# Patient Record
Sex: Male | Born: 1955 | Race: White | Hispanic: No | Marital: Married | Smoking: Former smoker
Health system: Southern US, Community
[De-identification: ages and names within clinical notes are randomized; demographics above are authoritative.]

## PROBLEM LIST (undated history)

## (undated) DIAGNOSIS — M797 Fibromyalgia: Secondary | ICD-10-CM

## (undated) DIAGNOSIS — G4733 Obstructive sleep apnea (adult) (pediatric): Secondary | ICD-10-CM

## (undated) DIAGNOSIS — I4901 Ventricular fibrillation: Secondary | ICD-10-CM

## (undated) DIAGNOSIS — I4581 Long QT syndrome: Secondary | ICD-10-CM

## (undated) DIAGNOSIS — M7918 Myalgia, other site: Secondary | ICD-10-CM

## (undated) HISTORY — PX: CARDIAC DEFIBRILLATOR PLACEMENT: SHX171

## (undated) HISTORY — DX: Obstructive sleep apnea (adult) (pediatric): G47.33

## (undated) HISTORY — DX: Ventricular fibrillation: I49.01

## (undated) HISTORY — DX: Long QT syndrome: I45.81

## (undated) HISTORY — PX: NECK SURGERY: SHX720

## (undated) HISTORY — DX: Myalgia, other site: M79.18

---

## 1998-04-11 HISTORY — PX: BACK SURGERY: SHX140

## 2002-12-23 DIAGNOSIS — D229 Melanocytic nevi, unspecified: Secondary | ICD-10-CM

## 2002-12-23 HISTORY — DX: Melanocytic nevi, unspecified: D22.9

## 2003-04-12 HISTORY — PX: CHOLECYSTECTOMY: SHX55

## 2010-01-29 ENCOUNTER — Encounter: Payer: Self-pay | Admitting: Pulmonary Disease

## 2010-02-25 ENCOUNTER — Encounter: Payer: Self-pay | Admitting: Pulmonary Disease

## 2010-02-25 ENCOUNTER — Ambulatory Visit (HOSPITAL_BASED_OUTPATIENT_CLINIC_OR_DEPARTMENT_OTHER)
Admission: RE | Admit: 2010-02-25 | Discharge: 2010-02-25 | Payer: Self-pay | Source: Home / Self Care | Admitting: Rheumatology

## 2010-03-06 ENCOUNTER — Ambulatory Visit: Payer: Self-pay | Admitting: Internal Medicine

## 2010-03-26 ENCOUNTER — Ambulatory Visit: Payer: Self-pay | Admitting: Pulmonary Disease

## 2010-03-26 DIAGNOSIS — G4752 REM sleep behavior disorder: Secondary | ICD-10-CM

## 2010-03-26 DIAGNOSIS — I4891 Unspecified atrial fibrillation: Secondary | ICD-10-CM

## 2010-03-26 DIAGNOSIS — IMO0001 Reserved for inherently not codable concepts without codable children: Secondary | ICD-10-CM

## 2010-03-26 DIAGNOSIS — G4733 Obstructive sleep apnea (adult) (pediatric): Secondary | ICD-10-CM

## 2010-04-08 ENCOUNTER — Encounter: Payer: Self-pay | Admitting: Pulmonary Disease

## 2010-04-21 ENCOUNTER — Telehealth (INDEPENDENT_AMBULATORY_CARE_PROVIDER_SITE_OTHER): Payer: Self-pay | Admitting: *Deleted

## 2010-05-05 ENCOUNTER — Encounter: Payer: Self-pay | Admitting: Pulmonary Disease

## 2010-05-10 ENCOUNTER — Encounter: Payer: Self-pay | Admitting: Pulmonary Disease

## 2010-05-13 ENCOUNTER — Encounter: Payer: Self-pay | Admitting: Pulmonary Disease

## 2010-05-13 NOTE — Progress Notes (Signed)
Summary: fax request  Phone Note Call from Patient Call back at Home Phone [phone]   Caller: Patient Call For: sood Summary of Call: pt requests that his sleep study and rx for cpap be faxed to: [facility] [phone]. pt can be reached at home # or [phone] Initial call taken by: Tivis Ringer, CNA,  April 21, 2010 5:06 PM  Follow-up for Phone Call        Faxed notes.//Juanita Follow-up by: Darletta Moll,  April 22, 2010 9:04 AM

## 2010-05-13 NOTE — Letter (Signed)
Summary: [facility]  [facility]   Imported By: Sherian Rein 04/08/2010 10:56:11  _____________________________________________________________________  External Attachment:    Type:   Image     Comment:   External Document

## 2010-05-13 NOTE — Assessment & Plan Note (Signed)
Summary: sleep apena/cb   Visit Type:  Initial Consult Copy to:  Dr. Pollyann Savoy  CC:  Sleep consult...epworth score is 16.Marland Kitchen  History of Present Illness: 55 yo male for sleep evauation.  He had sleep study on Nov. 17, 2011: AHI 16.1, SpO2 low 84%.  He is followed by Dr. Corliss Skains for fibromyalgia.  During this evaluation it was determined that he may have a sleep problem.  As a result he was scheduled for a sleep test and results are detailed above.  He has noticed a problem for some time, and things have been getting worse.  This has been associated with a 30 lbs weight gain over the past 5 years.    He goes to bed at 9pm.  He will take soma at 830 pm.  He falls asleep quickly.  He wakes up several times to use the bathroom and because of pain symptoms.  He usually can fall back to sleep.  He gets out of bed at 6am.  He feels tired and occasionally gets headaches in the morning.  He drinks a cup of coffee in the morning, but does not use anything else to help stay awake.  He does snore and will wake up with a gasp.  He has trouble sleeping on his back.  He does talk in his sleep, and used to grind his teeth.  He gets frequent dreams.  He has an episode about once every 6 months in which he will act out his dream.  Upon awakening he usually has dream recall, and is quickly aware of his surroundings.  He denies head injury, stroke, or seizure.  He quit smoking 9 years ago.  He has occasional alcohol use.  He denies thyroid problem or history of depression.   Preventive Screening-Counseling & Management  Alcohol-Tobacco     Alcohol drinks/day: <1     Smoking Status: quit     Packs/Day: 0.75     Year Started: 1984     Year Quit: 2002  Current Medications (verified): 1)  Inderal La 80 Mg Xr24h-Cap (Propranolol Hcl) .... 1/2 By Mouth Daily 2)  Soma 350 Mg Tabs (Carisoprodol) .Marland Kitchen.. 1 By Mouth Daily 3)  Cymbalta 60 Mg Cpep (Duloxetine Hcl) .Marland Kitchen.. 1 By Mouth Daily 4)  Lyrica 50 Mg Caps  (Pregabalin) .Marland Kitchen.. 1 By Mouth Three Times A Day 5)  Oxycontin 10 Mg Xr12h-Tab (Oxycodone Hcl) .Marland Kitchen.. 1 By Mouth Two Times A Day As Needed 6)  B-100  Tabs (Vitamins-Lipotropics) .Marland Kitchen.. 1 By Mouth Daily 7)  Magnesium 500 Mg Caps (Magnesium Oxide) .Marland Kitchen.. 1 By Mouth Daily 8)  Magnesium Oxide 400 Mg Caps (Magnesium Oxide) .... 2 By Mouth Three Times A Day 9)  Vitamin D3 400 Unit Tabs (Cholecalciferol) .Marland Kitchen.. 1 By Mouth Two Times A Day 10)  Coq10 100 Mg Caps (Coenzyme Q10) .Marland Kitchen.. 1 By Mouth Three Times A Day 11)  Calcium 500 Mg Tabs (Calcium) .Marland Kitchen.. 1 By Mouth Three Times A Day 12)  Fish Oil 1000 Mg Caps (Omega-3 Fatty Acids) .Marland Kitchen.. 1 By Mouth Daily 13)  Vitamin B-12 2500 Mcg Subl (Cyanocobalamin) .Marland Kitchen.. 1 By Mouth Daily  Allergies (verified): No Known Drug Allergies  Past History:  Past Medical History: Ventricular fibrillation Prolonged QT syndrome Myofascial pain syndrome Obstructive sleep apnea      - PSG 02/25/10 AHI 16.1  Physician roster: Rheum - [facility] - [facility]  Past Surgical History: Back surgery in 2000 Neck surgery in 2003 and 2005 Cholecystectomy in  2005  Family History: Family History Colon Cancer---father Family History Diabetes---father, MGM  Social History: Patient states former smoker.  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  The patient denies shortness of breath with activity, shortness of breath at rest, productive cough, non-productive cough, coughing up blood, chest pain, irregular heartbeats, acid heartburn, indigestion, loss of appetite, abdominal pain, difficulty swallowing, sore throat, tooth/dental problems, headaches, nasal congestion/difficulty breathing through nose, sneezing, itching, ear ache, anxiety, depression, hand/feet swelling, joint stiffness or pain, rash, change in color of mucus, and fever.    Vital Signs:  Patient profile:    55 year old male Height:      71 inches (180.34 cm) Weight:      236 pounds (107.27 kg) BMI:     33.03 O2 Sat:      99 % on Room air Temp:     98.0 degrees F (36.67 degrees C) oral Pulse rate:   72 / minute BP sitting:   110 / 74  (left arm) Cuff size:   large  Vitals Entered By: Michel Bickers CMA (March 26, 2010 8:46 AM)  O2 Sat at Rest %:  99 O2 Flow:  Room air CC: Sleep consult...epworth score is 16. Is Patient Diabetic? No Comments Medications reviewed with patient Michel Bickers [facility]  March 26, 2010 8:57 AM   Physical Exam  General:  normal appearance, healthy appearing, and obese.   Eyes:  PERRLA and EOMI.   Nose:  no deformity, discharge, inflammation, or lesions Mouth:  MP 4, enlarged tongue, retrognathic, elongated uvula Neck:  no JVD.   Chest Wall:  no deformities noted Lungs:  clear bilaterally to auscultation and percussion Heart:  regular rate and rhythm, S1, S2 without murmurs, rubs, gallops, or clicks Abdomen:  bowel sounds positive; abdomen soft and non-tender without masses, or organomegaly Msk:  no deformity or scoliosis noted with normal posture Pulses:  pulses normal Extremities:  no clubbing, cyanosis, edema, or deformity noted Neurologic:  normal CN II-XII and strength normal.   Cervical Nodes:  no significant adenopathy Psych:  alert and cooperative; normal mood and affect; normal attention span and concentration   Impression & Recommendations:  Problem # 1:  OBSTRUCTIVE SLEEP APNEA (ICD-327.23) He has sleep disruption, snore, apnea, and daytimes sleepiness.  He has moderate sleep apnea on his sleep study.  I reviewed the result of his sleep test.  Explained how sleep apnea can affect his health.  Driving precautions and need for weight loss were reviewed.  Treatment options were discussed.  Will proceed with autoCPAP titration at home.  Discussed techniques to adjust to using CPAP.  Explained that he may need in-lab titration if autoCPAP is  unsuccessful.  If he is not able to tolerate CPAP, then an oral appliance may be an option also.  Problem # 2:  FIBROMYALGIA (ICD-729.1) Explained to him how sleep disruption related to OSA can affect his fibromyalgia.  Hopefully his pain symptoms will improve with therapy for his sleep apnea.  Problem # 3:  REM SLEEP BEHAVIOR DISORDER (ICD-327.42) He gives typical description for RBD.  This can be seen with sleep apnea.  Will re-assess after he is established on CPAP therapy.  Discussed safe sleep environment.  Medications Added to Medication List This Visit: 1)  Inderal La 80 Mg Xr24h-cap (Propranolol hcl) .... 1/2 by mouth daily 2)  Soma 350 Mg  Tabs (Carisoprodol) .Marland Kitchen.. 1 by mouth daily 3)  Cymbalta 60 Mg Cpep (Duloxetine hcl) .Marland Kitchen.. 1 by mouth daily 4)  Lyrica 50 Mg Caps (Pregabalin) .Marland Kitchen.. 1 by mouth three times a day 5)  Oxycontin 10 Mg Xr12h-tab (Oxycodone hcl) .Marland Kitchen.. 1 by mouth two times a day as needed 6)  B-100 Tabs (Vitamins-lipotropics) .Marland Kitchen.. 1 by mouth daily 7)  Magnesium 500 Mg Caps (Magnesium oxide) .Marland Kitchen.. 1 by mouth daily 8)  Magnesium Oxide 400 Mg Caps (Magnesium oxide) .... 2 by mouth three times a day 9)  Vitamin D3 400 Unit Tabs (Cholecalciferol) .Marland Kitchen.. 1 by mouth two times a day 10)  Coq10 100 Mg Caps (Coenzyme q10) .Marland Kitchen.. 1 by mouth three times a day 11)  Calcium 500 Mg Tabs (Calcium) .Marland Kitchen.. 1 by mouth three times a day 12)  Fish Oil 1000 Mg Caps (Omega-3 fatty acids) .Marland Kitchen.. 1 by mouth daily 13)  Vitamin B-12 2500 Mcg Subl (Cyanocobalamin) .Marland Kitchen.. 1 by mouth daily  Complete Medication List: 1)  Inderal La 80 Mg Xr24h-cap (Propranolol hcl) .... 1/2 by mouth daily 2)  Soma 350 Mg Tabs (Carisoprodol) .Marland Kitchen.. 1 by mouth daily 3)  Cymbalta 60 Mg Cpep (Duloxetine hcl) .Marland Kitchen.. 1 by mouth daily 4)  Lyrica 50 Mg Caps (Pregabalin) .Marland Kitchen.. 1 by mouth three times a day 5)  Oxycontin 10 Mg Xr12h-tab (Oxycodone hcl) .Marland Kitchen.. 1 by mouth two times a day as needed 6)  B-100 Tabs (Vitamins-lipotropics) .Marland Kitchen.. 1 by  mouth daily 7)  Magnesium 500 Mg Caps (Magnesium oxide) .Marland Kitchen.. 1 by mouth daily 8)  Magnesium Oxide 400 Mg Caps (Magnesium oxide) .... 2 by mouth three times a day 9)  Vitamin D3 400 Unit Tabs (Cholecalciferol) .Marland Kitchen.. 1 by mouth two times a day 10)  Coq10 100 Mg Caps (Coenzyme q10) .Marland Kitchen.. 1 by mouth three times a day 11)  Calcium 500 Mg Tabs (Calcium) .Marland Kitchen.. 1 by mouth three times a day 12)  Fish Oil 1000 Mg Caps (Omega-3 fatty acids) .Marland Kitchen.. 1 by mouth daily 13)  Vitamin B-12 2500 Mcg Subl (Cyanocobalamin) .Marland Kitchen.. 1 by mouth daily  Other Orders: Consultation Level IV (04540) DME Referral (DME)  Patient Instructions: 1)  Will set up CPAP machine at home 2)  Follow up in 8 weeks

## 2010-05-13 NOTE — Letter (Signed)
Summary: CMN for PAP/[facility]  CMN for PAP/[facility]   Imported By: Sherian Rein 04/21/2010 14:38:32  _____________________________________________________________________  External Attachment:    Type:   Image     Comment:   External Document

## 2010-05-27 NOTE — Letter (Signed)
Summary: CMN for CPAP/[facility]  CMN for CPAP/[facility]   Imported By: Sherian Rein 05/18/2010 09:40:17  _____________________________________________________________________  External Attachment:    Type:   Image     Comment:   External Document

## 2010-06-02 NOTE — Letter (Signed)
Summary: CPAP Referral/[facility]  CPAP Referral/[facility]   Imported By: Sherian Rein 05/24/2010 08:19:27  _____________________________________________________________________  External Attachment:    Type:   Image     Comment:   External Document

## 2010-07-08 ENCOUNTER — Emergency Department (HOSPITAL_COMMUNITY): Payer: PRIVATE HEALTH INSURANCE

## 2010-07-08 ENCOUNTER — Emergency Department (HOSPITAL_COMMUNITY)
Admission: EM | Admit: 2010-07-08 | Discharge: 2010-07-08 | Disposition: A | Discharge status: Home / Self Care | Payer: PRIVATE HEALTH INSURANCE | Attending: Emergency Medicine | Admitting: Emergency Medicine

## 2010-07-08 DIAGNOSIS — R55 Syncope and collapse: Secondary | ICD-10-CM | POA: Insufficient documentation

## 2010-07-08 DIAGNOSIS — R112 Nausea with vomiting, unspecified: Secondary | ICD-10-CM | POA: Insufficient documentation

## 2010-07-08 DIAGNOSIS — R42 Dizziness and giddiness: Secondary | ICD-10-CM | POA: Insufficient documentation

## 2010-07-08 LAB — COMPREHENSIVE METABOLIC PANEL
ALT: 75 U/L — ABNORMAL HIGH (ref 0–53)
AST: 51 U/L — ABNORMAL HIGH (ref 0–37)
Calcium: 9 mg/dL (ref 8.4–10.5)
GFR calc Af Amer: >60 mL/min (ref >60)
Sodium: 138 mEq/L (ref 135–145)
Total Protein: 6.2 g/dL (ref 6.0–8.3)

## 2010-07-08 LAB — POCT CARDIAC MARKERS: Myoglobin, poc: 77 ng/mL (ref 12–200)

## 2010-07-08 LAB — CBC
MCHC: 35.6 g/dL (ref 30.0–36.0)
Platelets: 164 10*3/uL (ref 150–400)
RDW: 13.3 % (ref 11.5–15.5)

## 2010-07-08 LAB — DIFFERENTIAL
Eosinophils Absolute: 0.1 10*3/uL (ref 0.0–0.7)
Eosinophils Relative: 2 % (ref 0–5)
Lymphs Abs: 1.6 10*3/uL (ref 0.7–4.0)
Monocytes Absolute: 0.5 10*3/uL (ref 0.1–1.0)
Monocytes Relative: 10 % (ref 3–12)

## 2010-09-08 ENCOUNTER — Encounter: Payer: Self-pay | Admitting: Pulmonary Disease

## 2010-09-08 ENCOUNTER — Telehealth: Payer: Self-pay | Admitting: Pulmonary Disease

## 2010-09-08 NOTE — Telephone Encounter (Signed)
Will have my nurse inform patient that CPAP report looked good with good control of sleep apnea.    He is overdue for follow up, and will have my nurse schedule appointment.

## 2010-09-09 NOTE — Telephone Encounter (Signed)
I informed pt of VS's findings and recommendations. Pt verbalized understanding. Scheduled to be seen 09/10/10 at 11:45am.

## 2010-09-10 ENCOUNTER — Encounter: Payer: Self-pay | Admitting: Pulmonary Disease

## 2010-09-10 ENCOUNTER — Ambulatory Visit (INDEPENDENT_AMBULATORY_CARE_PROVIDER_SITE_OTHER): Payer: PRIVATE HEALTH INSURANCE | Admitting: Pulmonary Disease

## 2010-09-10 VITALS — BP 122/82 | HR 65 | Temp 97.7°F | Ht 71.0 in | Wt 239.6 lb

## 2010-09-10 DIAGNOSIS — G4752 REM sleep behavior disorder: Secondary | ICD-10-CM

## 2010-09-10 DIAGNOSIS — G4733 Obstructive sleep apnea (adult) (pediatric): Secondary | ICD-10-CM

## 2010-09-10 NOTE — Assessment & Plan Note (Signed)
His recent CPAP download showed good control of his apnea with CPAP at 10 cm H2O.  Will set his CPAP at this pressure.  He is to call if he has more trouble with change in pressure.

## 2010-09-10 NOTE — Progress Notes (Signed)
Subjective:    Patient ID: Sean Munoz, male    DOB: 09-20-1955, 55 y.o.   MRN: [id number]  HPI Copy to: Dr. Pollyann Savoy  55 yo male with OSA and RBD.  He has been doing well with CPAP.  He has a full face mask.  He feels this helps his sleep, and energy during the day.  He feels like he has too much pressure from the mask in the early morning hours.  He does not have trouble falling asleep with the mask.  He has some mask leak, and has tried to tighten the mask.  This does not always help.  Past Medical History  Diagnosis Date  . Ventricular fibrillation   . Prolonged QT syndrome   . Myofascial pain syndrome   . OSA (obstructive sleep apnea)     PSG 02/25/10  AHI 16.1     Family History  Problem Relation Age of Onset  . Colon cancer Father   . Diabetes Father   . Diabetes Maternal Grandmother      History   Social History  . Marital Status: Married    Spouse Name: N/A    Number of Children: N/A  . Years of Education: N/A   Occupational History  . Not on file.   Social History Main Topics  . Smoking status: Former Smoker -- 1.0 packs/day for 30 years    Quit date: 04/11/2000  . Smokeless tobacco: Not on file  . Alcohol Use: Not on file  . Drug Use: Not on file  . Sexually Active: Not on file   Other Topics Concern  . Not on file   Social History Narrative  . No narrative on file     No Known Allergies   No outpatient prescriptions prior to visit.   Review of Systems     Objective:   Physical Exam  Filed Vitals:   09/10/10 1221 09/10/10 1224  BP: 122/82   Pulse: 65   Temp: 97.7 F (36.5 C)   TempSrc: Oral   Height: 5\' 11"  (1.803 m)   Weight: 239 lb 9.6 oz (108.682 kg)   SpO2: 95% 95%    General: normal appearance, healthy appearing, and obese.  Eyes: PERRLA and EOMI.  Nose: no deformity, discharge, inflammation, or lesions  Mouth: MP 4, enlarged tongue, retrognathic, elongated uvula  Neck: no JVD.  Chest Wall: no deformities  noted  Lungs: clear bilaterally to auscultation and percussion  Heart: regular rate and rhythm, S1, S2 without murmurs, rubs, gallops, or clicks  Abdomen: bowel sounds positive; abdomen soft and non-tender without masses, or organomegaly  Msk: no deformity or scoliosis noted with normal posture  Pulses: pulses normal  Extremities: no clubbing, cyanosis, edema, or deformity noted  Neurologic: normal CN II-XII and strength normal.  Cervical Nodes: no significant adenopathy  Psych: alert and cooperative; normal mood and affect; normal attention span and concentration      Assessment & Plan:   OBSTRUCTIVE SLEEP APNEA His recent CPAP download showed good control of his apnea with CPAP at 10 cm H2O.  Will set his CPAP at this pressure.  He is to call if he has more trouble with change in pressure.  REM sleep behavior disorder Will monitor this clinically while treating his sleep apnea.      Updated Medication List Outpatient Encounter Prescriptions as of 09/10/2010  Medication Sig Dispense Refill  . Calcium Carbonate (CALCIUM 500 PO) Take 1 tablet by mouth 2 (two) times daily.        Marland Kitchen  carisoprodol (SOMA) 350 MG tablet Take 350 mg by mouth daily.        . Coenzyme Q10 (CO Q10) 100 MG CAPS Take 1 tablet by mouth 2 (two) times daily.        . Cyanocobalamin (VITAMIN B 12) 250 MCG LOZG Place 1 tablet under the tongue daily.        . DULoxetine (CYMBALTA) 60 MG capsule Take 60 mg by mouth daily.        . fish oil-omega-3 fatty acids 1000 MG capsule Take 2 g by mouth daily.        . magnesium oxide (MAG-OX) 400 MG tablet Take 400 mg by mouth 2 (two) times daily.        . Magnesium Oxide 500 MG TABS Take 1 tablet by mouth daily.        Marland Kitchen oxyCODONE-acetaminophen (PERCOCET) 10-325 MG per tablet Take 1 tablet by mouth every 8 (eight) hours as needed.        . pregabalin (LYRICA) 50 MG capsule Take 50 mg by mouth 2 (two) times daily.        . propranolol (INDERAL) 40 MG tablet Take 40 mg by mouth  daily.        . Thiamine HCl (VITAMIN B-1) 100 MG tablet Take 100 mg by mouth daily.        . vitamin E 400 UNIT capsule Take 400 Units by mouth 2 (two) times daily.        Marland Kitchen DISCONTD: oxyCODONE-acetaminophen (PERCOCET) 5-325 MG per tablet Take 1 tablet by mouth every 8 (eight) hours as needed.

## 2010-09-10 NOTE — Patient Instructions (Signed)
Will set CPAP at 10 cm H2O Follow up in 6 months

## 2010-09-10 NOTE — Assessment & Plan Note (Signed)
Will monitor this clinically while treating his sleep apnea.

## 2010-09-14 ENCOUNTER — Encounter: Payer: Self-pay | Admitting: Pulmonary Disease

## 2010-09-19 ENCOUNTER — Emergency Department (HOSPITAL_COMMUNITY)
Admission: EM | Admit: 2010-09-19 | Discharge: 2010-09-19 | Disposition: A | Discharge status: Home / Self Care | Payer: PRIVATE HEALTH INSURANCE | Attending: Emergency Medicine | Admitting: Emergency Medicine

## 2010-09-19 DIAGNOSIS — Z79899 Other long term (current) drug therapy: Secondary | ICD-10-CM | POA: Insufficient documentation

## 2010-09-19 DIAGNOSIS — Z950 Presence of cardiac pacemaker: Secondary | ICD-10-CM | POA: Insufficient documentation

## 2010-09-19 DIAGNOSIS — Y9239 Other specified sports and athletic area as the place of occurrence of the external cause: Secondary | ICD-10-CM | POA: Insufficient documentation

## 2010-09-19 DIAGNOSIS — W268XXA Contact with other sharp object(s), not elsewhere classified, initial encounter: Secondary | ICD-10-CM | POA: Insufficient documentation

## 2010-09-19 DIAGNOSIS — S6990XA Unspecified injury of unspecified wrist, hand and finger(s), initial encounter: Secondary | ICD-10-CM | POA: Insufficient documentation

## 2010-09-19 DIAGNOSIS — S61209A Unspecified open wound of unspecified finger without damage to nail, initial encounter: Secondary | ICD-10-CM | POA: Insufficient documentation

## 2013-03-23 IMAGING — CR DG CHEST 1V
1 series · 1 of 1 positions shown · non-contrast
Comparison: None

CLINICAL DATA: Syncope

CHEST - 1 VIEW

[view not recorded]
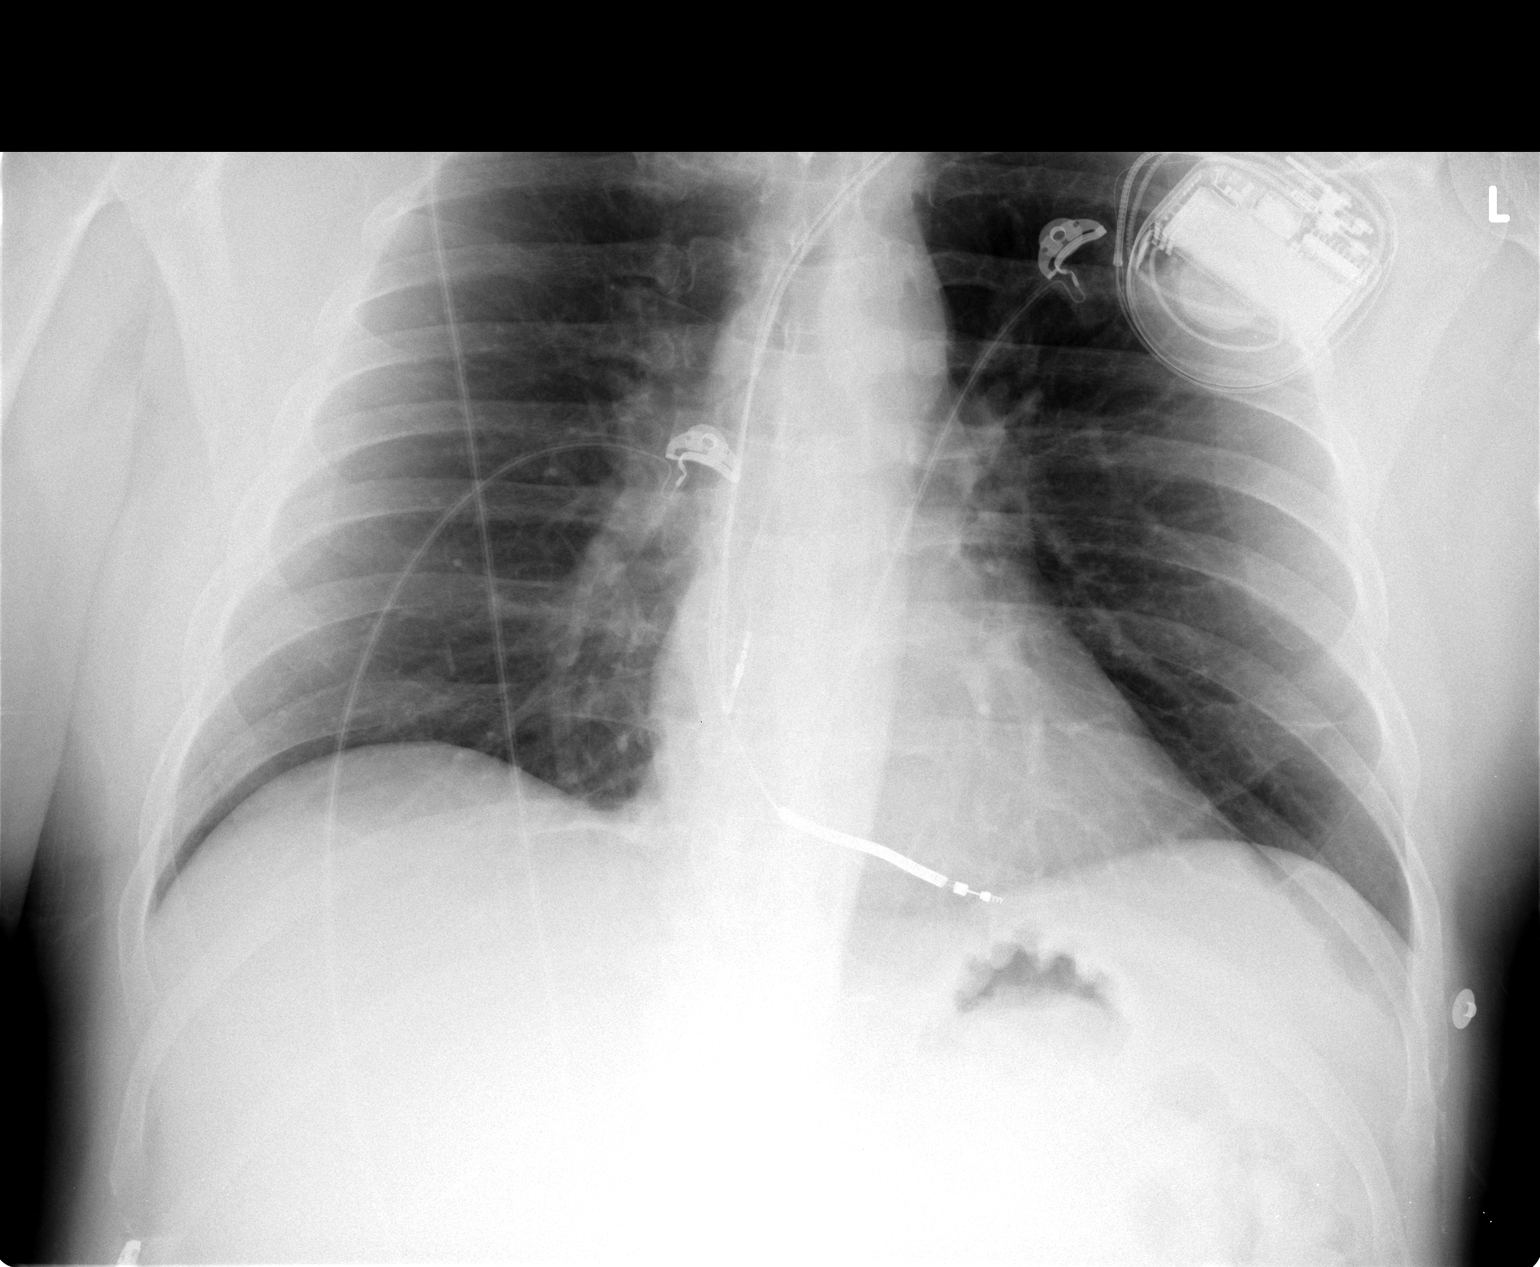

[1 of 1 positions shown; findings below may reference images not displayed]

FINDINGS: Heart size is normal.  Dual lead pacemaker with leads in
the right atrium and right ventricle.  Negative for heart failure.
Lungs are clear without infiltrate or effusion.
IMPRESSION: No active cardiopulmonary disease.

## 2013-07-16 ENCOUNTER — Emergency Department (HOSPITAL_BASED_OUTPATIENT_CLINIC_OR_DEPARTMENT_OTHER): Payer: PRIVATE HEALTH INSURANCE

## 2013-07-16 ENCOUNTER — Encounter (HOSPITAL_BASED_OUTPATIENT_CLINIC_OR_DEPARTMENT_OTHER): Payer: Self-pay | Admitting: Emergency Medicine

## 2013-07-16 ENCOUNTER — Emergency Department (HOSPITAL_BASED_OUTPATIENT_CLINIC_OR_DEPARTMENT_OTHER)
Admission: EM | Admit: 2013-07-16 | Discharge: 2013-07-16 | Disposition: A | Discharge status: Home / Self Care | Payer: PRIVATE HEALTH INSURANCE | Attending: Emergency Medicine | Admitting: Emergency Medicine

## 2013-07-16 DIAGNOSIS — W208XXA Other cause of strike by thrown, projected or falling object, initial encounter: Secondary | ICD-10-CM | POA: Insufficient documentation

## 2013-07-16 DIAGNOSIS — S60229A Contusion of unspecified hand, initial encounter: Secondary | ICD-10-CM | POA: Insufficient documentation

## 2013-07-16 DIAGNOSIS — S60222A Contusion of left hand, initial encounter: Secondary | ICD-10-CM

## 2013-07-16 DIAGNOSIS — Z8679 Personal history of other diseases of the circulatory system: Secondary | ICD-10-CM | POA: Insufficient documentation

## 2013-07-16 DIAGNOSIS — Z8669 Personal history of other diseases of the nervous system and sense organs: Secondary | ICD-10-CM | POA: Insufficient documentation

## 2013-07-16 DIAGNOSIS — IMO0002 Reserved for concepts with insufficient information to code with codable children: Secondary | ICD-10-CM | POA: Insufficient documentation

## 2013-07-16 DIAGNOSIS — Z87891 Personal history of nicotine dependence: Secondary | ICD-10-CM | POA: Insufficient documentation

## 2013-07-16 DIAGNOSIS — Z8739 Personal history of other diseases of the musculoskeletal system and connective tissue: Secondary | ICD-10-CM | POA: Insufficient documentation

## 2013-07-16 DIAGNOSIS — Y929 Unspecified place or not applicable: Secondary | ICD-10-CM | POA: Insufficient documentation

## 2013-07-16 DIAGNOSIS — Z79899 Other long term (current) drug therapy: Secondary | ICD-10-CM | POA: Insufficient documentation

## 2013-07-16 DIAGNOSIS — Y9389 Activity, other specified: Secondary | ICD-10-CM | POA: Insufficient documentation

## 2013-07-16 DIAGNOSIS — Z95810 Presence of automatic (implantable) cardiac defibrillator: Secondary | ICD-10-CM | POA: Insufficient documentation

## 2013-07-16 HISTORY — DX: Fibromyalgia: M79.7

## 2013-07-16 NOTE — Discharge Instructions (Signed)
Hand Contusion A hand contusion is a deep bruise on your hand area. Contusions are the result of an injury that caused bleeding under the skin. The contusion may turn blue, purple, or yellow. Minor injuries will give you a painless contusion, but more severe contusions may stay painful and swollen for a few weeks. CAUSES  A contusion is usually caused by a blow, trauma, or direct force to an area of the body. SYMPTOMS   Swelling and redness of the injured area.  Discoloration of the injured area.  Tenderness and soreness of the injured area.  Pain. DIAGNOSIS  The diagnosis can be made by taking a history and performing a physical exam. An X-ray, CT scan, or MRI may be needed to determine if there were any associated injuries, such as broken bones (fractures). TREATMENT  Often, the best treatment for a hand contusion is resting, elevating, icing, and applying cold compresses to the injured area. Over-the-counter medicines may also be recommended for pain control. HOME CARE INSTRUCTIONS   Put ice on the injured area.  Put ice in a plastic bag.  Place a towel between your skin and the bag.  Leave the ice on for 15-20 minutes, 03-04 times a day.  Only take over-the-counter or prescription medicines as directed by your caregiver. Your caregiver may recommend avoiding anti-inflammatory medicines (aspirin, ibuprofen, and naproxen) for 48 hours because these medicines may increase bruising.  If told, use an elastic wrap as directed. This can help reduce swelling. You may remove the wrap for sleeping, showering, and bathing. If your fingers become numb, cold, or blue, take the wrap off and reapply it more loosely.  Elevate your hand with pillows to reduce swelling.  Avoid overusing your hand if it is painful. SEEK IMMEDIATE MEDICAL CARE IF:   You have increased redness, swelling, or pain in your hand.  Your swelling or pain is not relieved with medicines.  You have loss of feeling in  your hand or are unable to move your fingers.  Your hand turns cold or blue.  You have pain when you move your fingers.  Your hand becomes warm to the touch.  Your contusion does not improve in 2 days. MAKE SURE YOU:   Understand these instructions.  Will watch your condition.  Will get help right away if you are not doing well or get worse. Document Released: 09/17/2001 Document Revised: 12/21/2011 Document Reviewed: 09/19/2011 [facility] Patient Information 2014 [employer].  Cryotherapy Cryotherapy means treatment with cold. Ice or gel packs can be used to reduce both pain and swelling. Ice is the most helpful within the first 24 to 48 hours after an injury or flareup from overusing a muscle or joint. Sprains, strains, spasms, burning pain, shooting pain, and aches can all be eased with ice. Ice can also be used when recovering from surgery. Ice is effective, has very few side effects, and is safe for most people to use. PRECAUTIONS  Ice is not a safe treatment option for people with:  Raynaud's phenomenon. This is a condition affecting small blood vessels in the extremities. Exposure to cold may cause your problems to return.  Cold hypersensitivity. There are many forms of cold hypersensitivity, including:  Cold urticaria. Red, itchy hives appear on the skin when the tissues begin to warm after being iced.  Cold erythema. This is a red, itchy rash caused by exposure to cold.  Cold hemoglobinuria. Red blood cells break down when the tissues begin to warm after being iced.  The hemoglobin that carry oxygen are passed into the urine because they cannot combine with blood proteins fast enough.  Numbness or altered sensitivity in the area being iced. If you have any of the following conditions, do not use ice until you have discussed cryotherapy with your caregiver:  Heart conditions, such as arrhythmia, angina, or chronic heart disease.  High blood pressure.  Healing wounds  or open skin in the area being iced.  Current infections.  Rheumatoid arthritis.  Poor circulation.  Diabetes. Ice slows the blood flow in the region it is applied. This is beneficial when trying to stop inflamed tissues from spreading irritating chemicals to surrounding tissues. However, if you expose your skin to cold temperatures for too long or without the proper protection, you can damage your skin or nerves. Watch for signs of skin damage due to cold. HOME CARE INSTRUCTIONS Follow these tips to use ice and cold packs safely.  Place a dry or damp towel between the ice and skin. A damp towel will cool the skin more quickly, so you may need to shorten the time that the ice is used.  For a more rapid response, add gentle compression to the ice.  Ice for no more than 10 to 20 minutes at a time. The bonier the area you are icing, the less time it will take to get the benefits of ice.  Check your skin after 5 minutes to make sure there are no signs of a poor response to cold or skin damage.  Rest 20 minutes or more in between uses.  Once your skin is numb, you can end your treatment. You can test numbness by very lightly touching your skin. The touch should be so light that you do not see the skin dimple from the pressure of your fingertip. When using ice, most people will feel these normal sensations in this order: cold, burning, aching, and numbness.  Do not use ice on someone who cannot communicate their responses to pain, such as small children or people with dementia. HOW TO MAKE AN ICE PACK Ice packs are the most common way to use ice therapy. Other methods include ice massage, ice baths, and cryo-sprays. Muscle creams that cause a cold, tingly feeling do not offer the same benefits that ice offers and should not be used as a substitute unless recommended by your caregiver. To make an ice pack, do one of the following:  Place crushed ice or a bag of frozen vegetables in a sealable  plastic bag. Squeeze out the excess air. Place this bag inside another plastic bag. Slide the bag into a pillowcase or place a damp towel between your skin and the bag.  Mix 3 parts water with 1 part rubbing alcohol. Freeze the mixture in a sealable plastic bag. When you remove the mixture from the freezer, it will be slushy. Squeeze out the excess air. Place this bag inside another plastic bag. Slide the bag into a pillowcase or place a damp towel between your skin and the bag. SEEK MEDICAL CARE IF:  You develop white spots on your skin. This may give the skin a blotchy (mottled) appearance.  Your skin turns blue or pale.  Your skin becomes waxy or hard.  Your swelling gets worse. MAKE SURE YOU:   Understand these instructions.  Will watch your condition.  Will get help right away if you are not doing well or get worse. Document Released: 11/22/2010 Document Revised: 06/20/2011 Document Reviewed: 11/22/2010 [employer] Patient  Information ©2014 [employer]. ° °

## 2013-07-16 NOTE — ED Provider Notes (Signed)
Medical screening examination/treatment/procedure(s) were performed by non-physician practitioner and as supervising physician I was immediately available for consultation/collaboration.   EKG Interpretation None        Houston Siren III, MD 07/16/13 1550

## 2013-07-16 NOTE — ED Notes (Signed)
hit left hand with hammer yesterday-bruising/ abrasions noted

## 2013-07-16 NOTE — ED Provider Notes (Signed)
CSN: [id number]     Arrival date & time 07/16/13  1207 History   First MD Initiated Contact with Patient 07/16/13 1215     Chief Complaint  Patient presents with  . Hand Injury     (Consider location/radiation/quality/duration/timing/severity/associated sxs/prior Treatment) Patient is a 58 y.o. male presenting with hand injury. The history is provided by the patient. No language interpreter was used.  Hand Injury Location:  Hand Time since incident:  1 day Hand location:  L hand Associated symptoms: no fever   Associated symptoms comment:  He complains of left hand pain after injury last night. He was hammering when the hammer head flew off from the handle striking him in the dorsal left hand. He reports onset of pain several hours later that woke him from sleep, and swelling that has been progressive since that time. No numbness. No other injury.   Past Medical History  Diagnosis Date  . Ventricular fibrillation   . Prolonged QT syndrome   . Myofascial pain syndrome   . OSA (obstructive sleep apnea)     PSG 02/25/10  AHI 16.1  . Fibromyalgia    Past Surgical History  Procedure Laterality Date  . Back surgery  2000  . Neck surgery  2003 and 2005  . Cholecystectomy  2005  . Cardiac defibrillator placement     Family History  Problem Relation Age of Onset  . Colon cancer Father   . Diabetes Father   . Diabetes Maternal Grandmother    History  Substance Use Topics  . Smoking status: Former Smoker -- 1.00 packs/day for 30 years    Quit date: 04/11/2000  . Smokeless tobacco: Not on file  . Alcohol Use: Yes    Review of Systems  Constitutional: Negative for fever and chills.  HENT: Negative.   Respiratory: Negative.   Cardiovascular: Negative.   Gastrointestinal: Negative.   Musculoskeletal: Negative.        See HPI  Skin: Negative.   Neurological: Negative.       Allergies  Review of patient's allergies indicates no known allergies.  Home Medications    Current Outpatient Rx  Name  Route  Sig  Dispense  Refill  . OxyCODONE HCl (OXYCONTIN PO)   Oral   Take by mouth.         . Calcium Carbonate (CALCIUM 500 PO)   Oral   Take 1 tablet by mouth 2 (two) times daily.           . carisoprodol (SOMA) 350 MG tablet   Oral   Take 350 mg by mouth daily.           . Coenzyme Q10 (CO Q10) 100 MG CAPS   Oral   Take 1 tablet by mouth 2 (two) times daily.           . Cyanocobalamin (VITAMIN B 12) 250 MCG LOZG   Sublingual   Place 1 tablet under the tongue daily.           . DULoxetine (CYMBALTA) 60 MG capsule   Oral   Take 60 mg by mouth daily.           . fish oil-omega-3 fatty acids 1000 MG capsule   Oral   Take 2 g by mouth daily.           . magnesium oxide (MAG-OX) 400 MG tablet   Oral   Take 400 mg by mouth 2 (two) times daily.           Marland Kitchen  Magnesium Oxide 500 MG TABS   Oral   Take 1 tablet by mouth daily.           Marland Kitchen oxyCODONE-acetaminophen (PERCOCET) 10-325 MG per tablet   Oral   Take 1 tablet by mouth every 8 (eight) hours as needed.           . pregabalin (LYRICA) 50 MG capsule   Oral   Take 50 mg by mouth 2 (two) times daily.           . propranolol (INDERAL) 40 MG tablet   Oral   Take 40 mg by mouth daily.           . Thiamine HCl (VITAMIN B-1) 100 MG tablet   Oral   Take 100 mg by mouth daily.           . vitamin E 400 UNIT capsule   Oral   Take 400 Units by mouth 2 (two) times daily.            BP 169/101  Pulse 78  Temp(Src) 98.8 F (37.1 C) (Oral)  Resp 18  Ht 5\' 10"  (1.778 m)  Wt 240 lb (108.863 kg)  BMI 34.44 kg/m2  SpO2 99% Physical Exam  Constitutional: He is oriented to person, place, and time. He appears well-developed and well-nourished.  Neck: Normal range of motion.  Pulmonary/Chest: Effort normal.  Musculoskeletal: Normal range of motion.  Left hand swollen and ecchymotic over 4th and 5th MCs without bony deformity. FROM all digits of left hand with  full tendon function. No wrist tenderness or swelling.   Neurological: He is alert and oriented to person, place, and time.  Skin: Skin is warm and dry.  Three small superficial abrasions dorsal left hand.   Psychiatric: He has a normal mood and affect.    ED Course  Procedures (including critical care time) Labs Review Labs Reviewed - No data to display Imaging Review Dg Hand Complete Left  07/16/2013   CLINICAL DATA:  Hit hand with hammer yesterday. Swelling in the metacarpal region.  EXAM: LEFT HAND - COMPLETE 3+ VIEW  COMPARISON:  None.  FINDINGS: There is no evidence of fracture or dislocation. There is no evidence of arthropathy or other focal bone abnormality. There is soft tissue swelling over the dorsum of the hand at the level of the metacarpals.  IMPRESSION: Soft tissue swelling overlying the metacarpals. No acute bony abnormality.   Electronically Signed   By: Curlene Dolphin M.D.   On: 07/16/2013 12:34     EKG Interpretation None      MDM   Final diagnoses:  None    1. Contusion left hand  Non-fracture contusion injury left hand without complication.    Dewaine Oats, PA-C 07/16/13 1240

## 2016-03-31 IMAGING — CR DG HAND COMPLETE 3+V*L*
3 series · 3 of 3 positions shown · non-contrast
Comparison: None.

CLINICAL DATA: Hit hand with hammer yesterday. Swelling in the
metacarpal region.

EXAM:
LEFT HAND - COMPLETE 3+ VIEW

[x hand pa left]
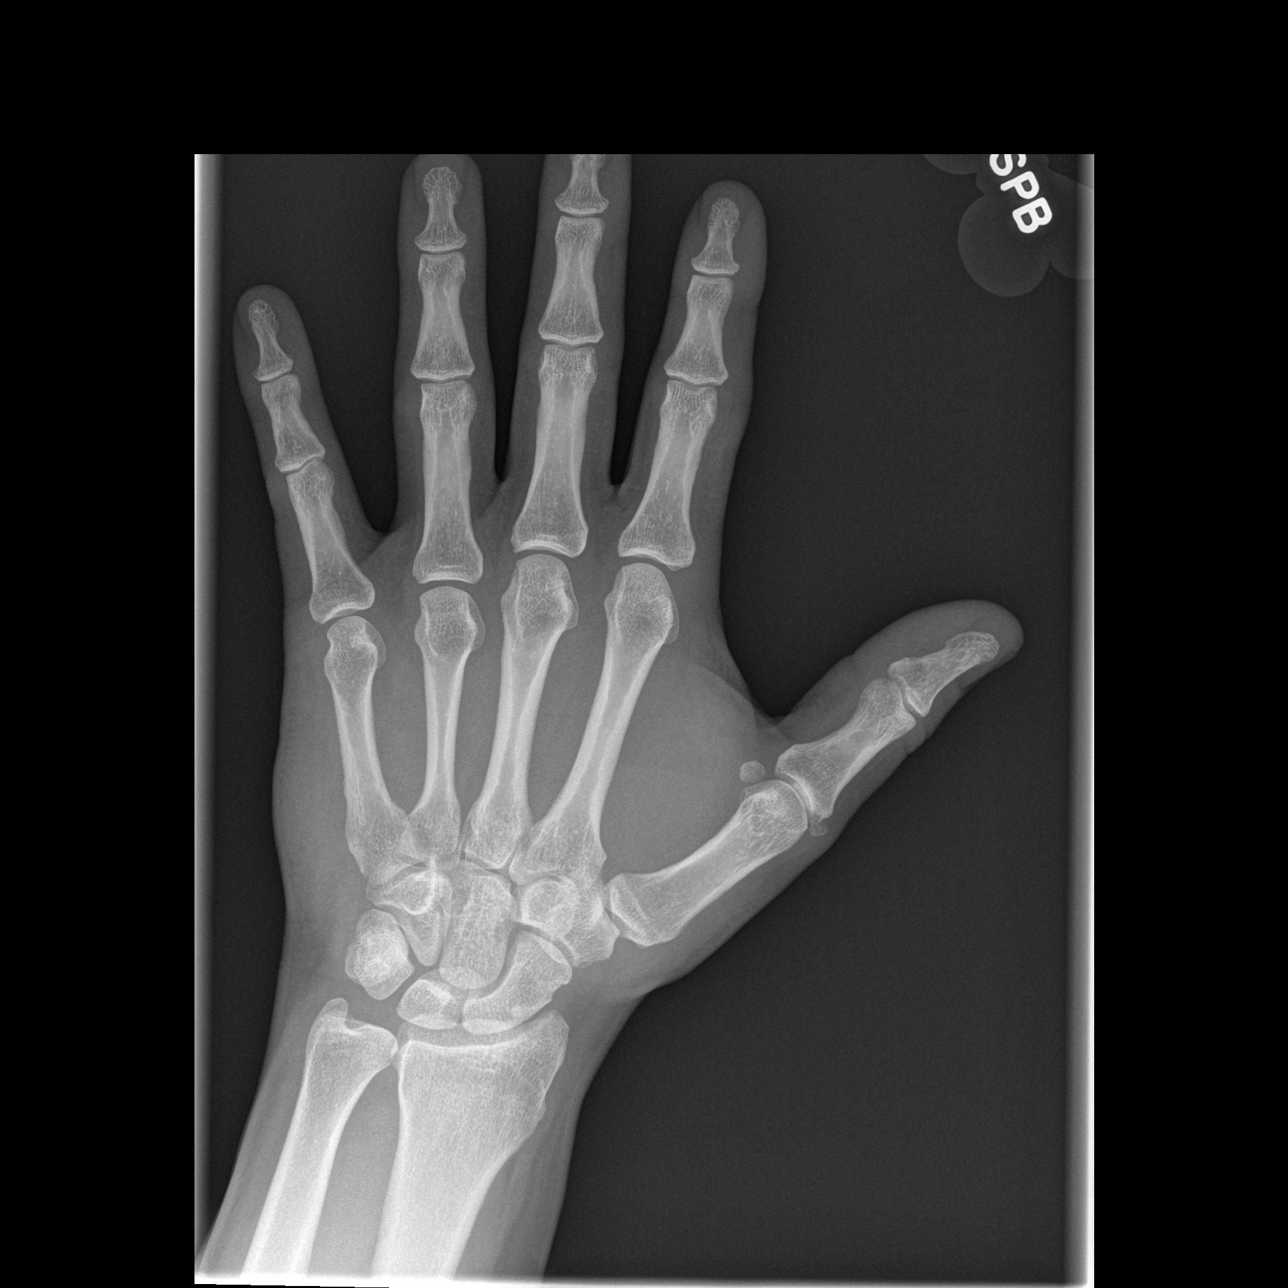

[x hand oblique left]
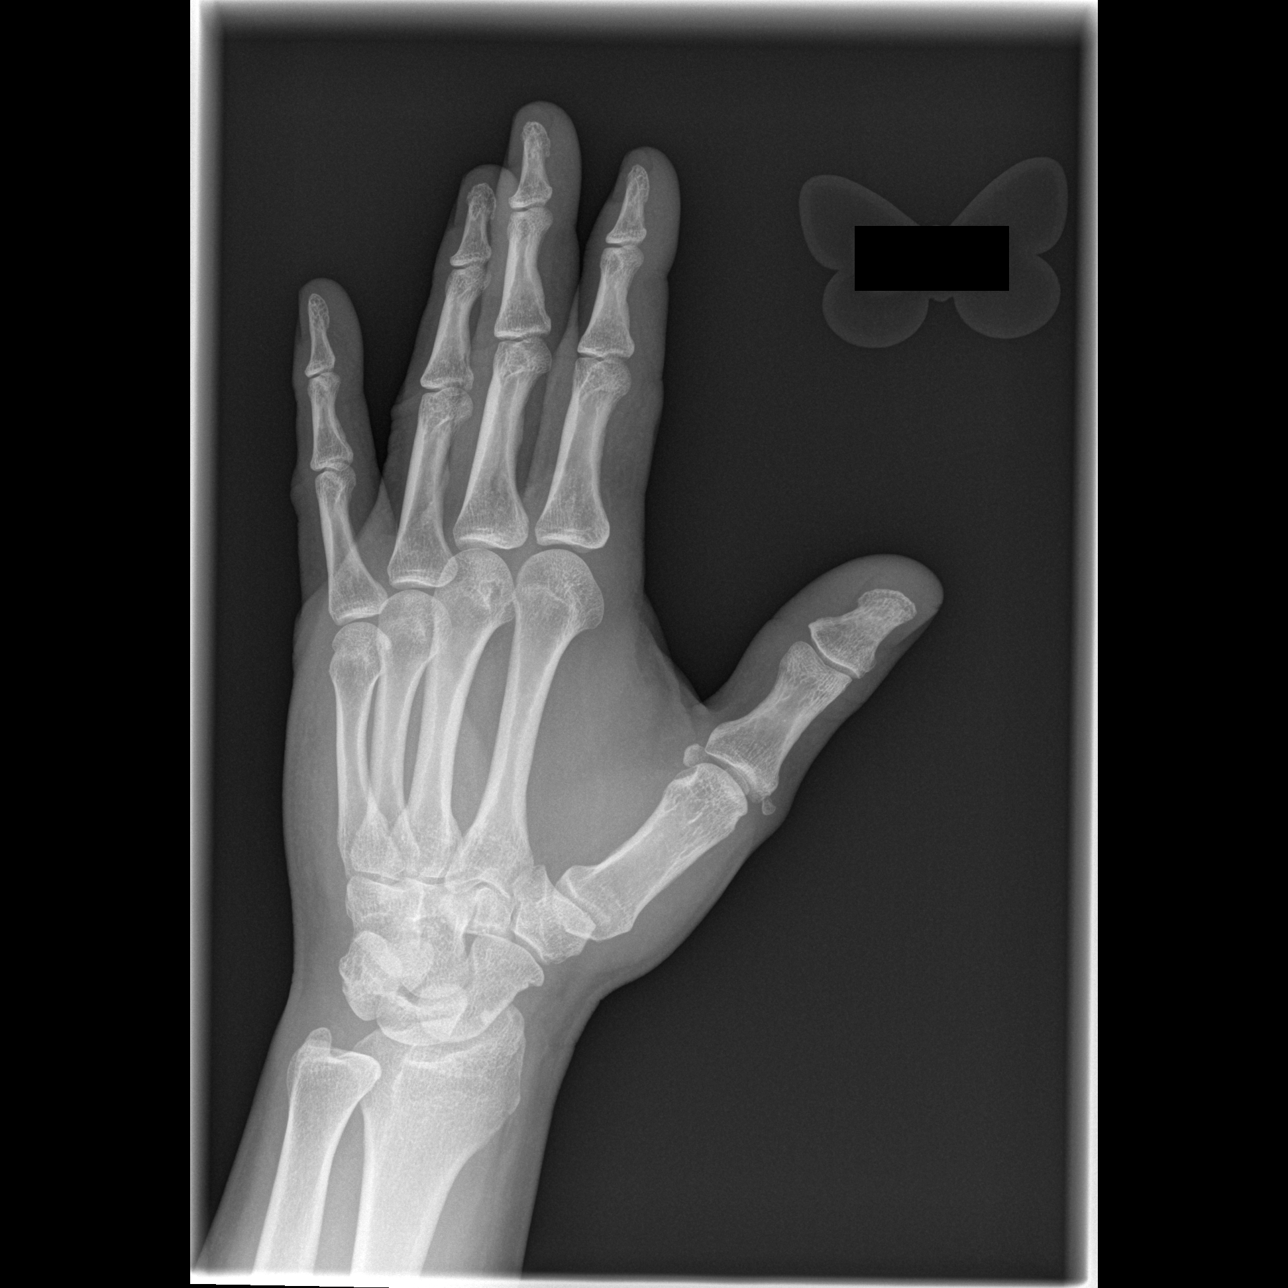

[x hand lat left]
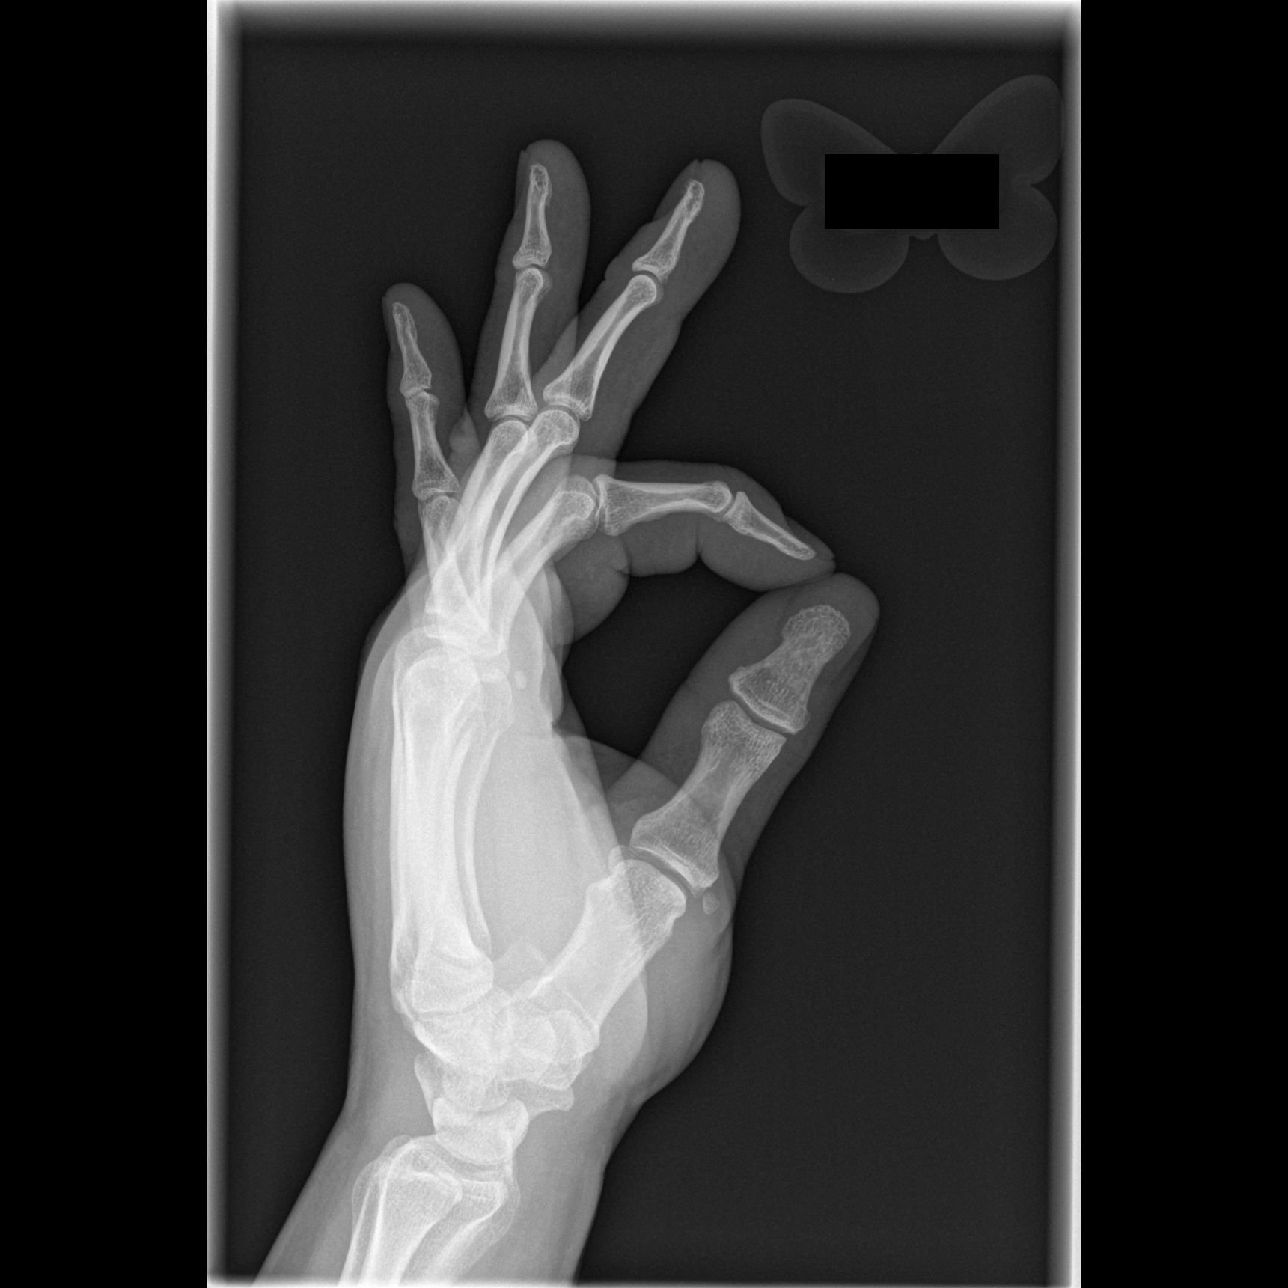

[3 of 3 positions shown; findings below may reference images not displayed]

FINDINGS: There is no evidence of fracture or dislocation. There is no
evidence of arthropathy or other focal bone abnormality. There is
soft tissue swelling over the dorsum of the hand at the level of the
metacarpals.
IMPRESSION: Soft tissue swelling overlying the metacarpals. No acute bony
abnormality.
